# Patient Record
Sex: Female | Born: 1935 | Race: Asian | Hispanic: No | Marital: Single | Smoking: Never smoker
Health system: Southern US, Community
[De-identification: ages and names within clinical notes are randomized; demographics above are authoritative.]

## PROBLEM LIST (undated history)

## (undated) DIAGNOSIS — E119 Type 2 diabetes mellitus without complications: Secondary | ICD-10-CM

---

## 2019-05-22 ENCOUNTER — Other Ambulatory Visit: Payer: Self-pay

## 2019-05-22 ENCOUNTER — Emergency Department (HOSPITAL_BASED_OUTPATIENT_CLINIC_OR_DEPARTMENT_OTHER): Payer: Medicare HMO

## 2019-05-22 ENCOUNTER — Encounter (HOSPITAL_BASED_OUTPATIENT_CLINIC_OR_DEPARTMENT_OTHER): Payer: Self-pay | Admitting: *Deleted

## 2019-05-22 ENCOUNTER — Emergency Department (HOSPITAL_BASED_OUTPATIENT_CLINIC_OR_DEPARTMENT_OTHER)
Admission: EM | Admit: 2019-05-22 | Discharge: 2019-05-22 | Disposition: A | Discharge status: Home / Self Care | Payer: Medicare HMO | Attending: Emergency Medicine | Admitting: Emergency Medicine

## 2019-05-22 DIAGNOSIS — M79661 Pain in right lower leg: Secondary | ICD-10-CM | POA: Insufficient documentation

## 2019-05-22 DIAGNOSIS — Z7984 Long term (current) use of oral hypoglycemic drugs: Secondary | ICD-10-CM | POA: Insufficient documentation

## 2019-05-22 DIAGNOSIS — R739 Hyperglycemia, unspecified: Secondary | ICD-10-CM

## 2019-05-22 DIAGNOSIS — E1165 Type 2 diabetes mellitus with hyperglycemia: Secondary | ICD-10-CM | POA: Insufficient documentation

## 2019-05-22 DIAGNOSIS — R6883 Chills (without fever): Secondary | ICD-10-CM | POA: Diagnosis present

## 2019-05-22 DIAGNOSIS — R918 Other nonspecific abnormal finding of lung field: Secondary | ICD-10-CM | POA: Insufficient documentation

## 2019-05-22 DIAGNOSIS — N3001 Acute cystitis with hematuria: Secondary | ICD-10-CM | POA: Diagnosis not present

## 2019-05-22 DIAGNOSIS — M79662 Pain in left lower leg: Secondary | ICD-10-CM | POA: Insufficient documentation

## 2019-05-22 DIAGNOSIS — R9389 Abnormal findings on diagnostic imaging of other specified body structures: Secondary | ICD-10-CM

## 2019-05-22 HISTORY — DX: Type 2 diabetes mellitus without complications: E11.9

## 2019-05-22 LAB — CBC WITH DIFFERENTIAL/PLATELET
Abs Immature Granulocytes: 0.07 10*3/uL (ref 0.00–0.07)
Basophils Absolute: 0 10*3/uL (ref 0.0–0.1)
Basophils Relative: 0 %
Eosinophils Absolute: 0.1 10*3/uL (ref 0.0–0.5)
Eosinophils Relative: 1 %
HCT: 38.5 % (ref 36.0–46.0)
Hemoglobin: 12.8 g/dL (ref 12.0–15.0)
Immature Granulocytes: 1 %
Lymphocytes Relative: 20 %
Lymphs Abs: 2.4 10*3/uL (ref 0.7–4.0)
MCH: 28.3 pg (ref 26.0–34.0)
MCHC: 33.2 g/dL (ref 30.0–36.0)
MCV: 85 fL (ref 80.0–100.0)
Monocytes Absolute: 0.7 10*3/uL (ref 0.1–1.0)
Monocytes Relative: 6 %
Neutro Abs: 8.8 10*3/uL — ABNORMAL HIGH (ref 1.7–7.7)
Neutrophils Relative %: 72 %
Platelets: 142 10*3/uL — ABNORMAL LOW (ref 150–400)
RBC: 4.53 MIL/uL (ref 3.87–5.11)
RDW: 12.7 % (ref 11.5–15.5)
WBC: 12.1 10*3/uL — ABNORMAL HIGH (ref 4.0–10.5)
nRBC: 0 % (ref 0.0–0.2)

## 2019-05-22 LAB — BASIC METABOLIC PANEL
Anion gap: 10 (ref 5–15)
BUN: 18 mg/dL (ref 8–23)
CO2: 21 mmol/L — ABNORMAL LOW (ref 22–32)
Calcium: 9.7 mg/dL (ref 8.9–10.3)
Chloride: 100 mmol/L (ref 98–111)
Creatinine, Ser: 0.87 mg/dL (ref 0.44–1.00)
GFR calc Af Amer: >60 mL/min (ref >60)
GFR calc non Af Amer: >60 mL/min (ref >60)
Glucose, Bld: 381 mg/dL — ABNORMAL HIGH (ref 70–99)
Potassium: 4.5 mmol/L (ref 3.5–5.1)
Sodium: 131 mmol/L — ABNORMAL LOW (ref 135–145)

## 2019-05-22 LAB — URINALYSIS, ROUTINE W REFLEX MICROSCOPIC
Bilirubin Urine: NEGATIVE
Glucose, UA: >=500 mg/dL — AB
Ketones, ur: NEGATIVE mg/dL
Nitrite: NEGATIVE
Protein, ur: NEGATIVE mg/dL
Specific Gravity, Urine: <1.005 — ABNORMAL LOW (ref 1.005–1.030)
pH: 6.5 (ref 5.0–8.0)

## 2019-05-22 LAB — POCT I-STAT EG7
Acid-base deficit: 1 mmol/L (ref 0.0–2.0)
Bicarbonate: 22.6 mmol/L (ref 20.0–28.0)
Calcium, Ion: 1.27 mmol/L (ref 1.15–1.40)
HCT: 36 % (ref 36.0–46.0)
Hemoglobin: 12.2 g/dL (ref 12.0–15.0)
O2 Saturation: 95 %
Patient temperature: 98.8
Potassium: 4.1 mmol/L (ref 3.5–5.1)
Sodium: 132 mmol/L — ABNORMAL LOW (ref 135–145)
TCO2: 24 mmol/L (ref 22–32)
pCO2, Ven: 34.2 mmHg — ABNORMAL LOW (ref 44.0–60.0)
pH, Ven: 7.428 (ref 7.250–7.430)
pO2, Ven: 73 mmHg — ABNORMAL HIGH (ref 32.0–45.0)

## 2019-05-22 LAB — URINALYSIS, MICROSCOPIC (REFLEX): WBC, UA: >50 WBC/hpf (ref 0–5)

## 2019-05-22 LAB — CBG MONITORING, ED
Glucose-Capillary: 391 mg/dL — ABNORMAL HIGH (ref 70–99)
Glucose-Capillary: 258 mg/dL — ABNORMAL HIGH (ref 70–99)

## 2019-05-22 MED ORDER — SODIUM CHLORIDE 0.9 % IV BOLUS
500.0000 mL | Freq: Once | INTRAVENOUS | Status: AC
  Administered 2019-05-22: 500 mL via INTRAVENOUS

## 2019-05-22 MED ORDER — GLUCOSE BLOOD VI STRP: ORAL_STRIP | 12 refills | Status: AC

## 2019-05-22 MED ORDER — DOXYCYCLINE HYCLATE 100 MG PO CAPS: 100.0000 mg | ORAL_CAPSULE | Freq: Two times a day (BID) | ORAL | 0 refills | Status: AC

## 2019-05-22 NOTE — ED Notes (Signed)
Social work on phone with patient and family

## 2019-05-22 NOTE — ED Provider Notes (Signed)
[facility] EMERGENCY DEPARTMENT Provider Note   CSN: [id number] Arrival date & time: 05/22/19  1405     History No chief complaint on file.   Rachael Marks is a 83 y.o. female with history of diabetes mellitus presents today for evaluation of acute onset, persistent chills since yesterday.  Denies headache, chest pain, shortness of breath, cough, sore throat, nasal congestion, abdominal pain, nausea, vomiting, or diarrhea.  Denies urinary symptoms.  Also notes for the last 3 days has had bilateral lower extremity pain but worse in the left.  Denies leg swelling, recent travel or surgeries, hemoptysis, prior history of DVT or PE, or hormone replacement therapy.  She went to urgent care for her symptoms and was noted to be hyperglycemic and had a UTI.  Her rapid Covid test there was negative.  She notes no known sick contacts.  She was started on Augmentin for her UTI but it was recommended that she come to the ED for further evaluation and management.  Of note, she tells me she moved here from [place] in August and has not yet been able to set up follow-up with a primary care physician.  Reports that she ran out of glucometer lancets in November but her blood sugars were around 180 at the time and her blood sugar today was over 300.  She lives by herself 5 minutes away from her daughter.  The history is provided by the patient and a relative.       Past Medical History:  Diagnosis Date  . Diabetes mellitus without complication (HCC)     There are no problems to display for this patient.   History reviewed. No pertinent surgical history.   OB History   No obstetric history on file.     No family history on file.  Social History   Tobacco Use  . Smoking status: Never Smoker  . Smokeless tobacco: Never Used  Substance Use Topics  . Alcohol use: Never  . Drug use: Never    Home Medications Prior to Admission medications   Medication Sig Start Date End Date  Taking? Authorizing Provider  amoxicillin-clavulanate (AUGMENTIN) 500-125 MG tablet Take by mouth. 05/22/19 05/29/19 Yes [provider]  glipiZIDE (GLUCOTROL XL) 2.5 MG 24 hr tablet  04/25/19  Yes [provider]  losartan (COZAAR) 50 MG tablet  04/25/19  Yes [provider]  metFORMIN (GLUCOPHAGE) 1000 MG tablet  04/25/19  Yes [provider]  doxycycline (VIBRAMYCIN) 100 MG capsule Take 1 capsule (100 mg total) by mouth 2 (two) times daily for 5 days. 05/22/19 05/27/19  Michela Pitcher A, PA-C  glucose blood test strip Use as instructed 05/22/19   Michela Pitcher A, PA-C    Allergies    Patient has no known allergies.  Review of Systems   Review of Systems  Constitutional: Positive for chills. Negative for fever.  Respiratory: Negative for cough and shortness of breath.   Cardiovascular: Negative for chest pain and leg swelling.  Gastrointestinal: Negative for abdominal pain, diarrhea, nausea and vomiting.  Genitourinary: Negative for dysuria, frequency, hematuria and urgency.  Musculoskeletal: Positive for myalgias.  Neurological: Negative for headaches.  All other systems reviewed and are negative.   Physical Exam Updated Vital Signs BP 129/68 (BP Location: Right Arm)   Pulse 84   Temp 98.8 F (37.1 C) (Oral)   Resp 16   Ht  (1.575 m)   Wt 49.9 kg   SpO2 99%   BMI 20.12  kg/m   Physical Exam Vitals and nursing note reviewed.  Constitutional:      General: She is not in acute distress.    Appearance: She is well-developed.  HENT:     Head: Normocephalic and atraumatic.  Eyes:     General:        Right eye: No discharge.        Left eye: No discharge.     Conjunctiva/sclera: Conjunctivae normal.  Neck:     Vascular: No JVD.     Trachea: No tracheal deviation.  Cardiovascular:     Rate and Rhythm: Normal rate and regular rhythm.     Pulses: Normal pulses.     Comments: 2+ radial and DP/PT pulses bilaterally, Homans sign absent  bilaterally, no lower extremity edema, no palpable cords, compartments are soft  Pulmonary:     Effort: Pulmonary effort is normal.     Comments: Diminished breath sounds in the left lung base, crackles in the right lung base.  Speaking in full sentences without difficulty, SPO2 saturations 94 to 95% on room air. Abdominal:     General: Abdomen is flat. There is no distension.     Palpations: Abdomen is soft.     Tenderness: There is no abdominal tenderness. There is no right CVA tenderness, left CVA tenderness, guarding or rebound.  Musculoskeletal:     Cervical back: Normal range of motion and neck supple.  Skin:    General: Skin is warm and dry.     Findings: No erythema.  Neurological:     Mental Status: She is alert.  Psychiatric:        Behavior: Behavior normal.     ED Results / Procedures / Treatments   Labs (all labs ordered are listed, but only abnormal results are displayed) Labs Reviewed  CBC WITH DIFFERENTIAL/PLATELET - Abnormal; Notable for the following components:      Result Value   WBC 12.1 (*)    Platelets 142 (*)    Neutro Abs 8.8 (*)    All other components within normal limits  BASIC METABOLIC PANEL - Abnormal; Notable for the following components:   Sodium 131 (*)    CO2 21 (*)    Glucose, Bld 381 (*)    All other components within normal limits  URINALYSIS, ROUTINE W REFLEX MICROSCOPIC - Abnormal; Notable for the following components:   APPearance HAZY (*)    Specific Gravity, Urine <1.005 (*)    Glucose, UA >=500 (*)    Hgb urine dipstick SMALL (*)    Leukocytes,Ua MODERATE (*)    All other components within normal limits  URINALYSIS, MICROSCOPIC (REFLEX) - Abnormal; Notable for the following components:   Bacteria, UA FEW (*)    All other components within normal limits  CBG MONITORING, ED - Abnormal; Notable for the following components:   Glucose-Capillary 391 (*)    All other components within normal limits  POCT I-STAT EG7 - Abnormal;  Notable for the following components:   pCO2, Ven 34.2 (*)    pO2, Ven 73.0 (*)    Sodium 132 (*)    All other components within normal limits  CBG MONITORING, ED - Abnormal; Notable for the following components:   Glucose-Capillary 258 (*)    All other components within normal limits  URINE CULTURE  I-STAT VENOUS BLOOD GAS, ED    EKG None  Radiology DG Chest 2 View  Result Date: 05/22/2019 CLINICAL DATA:  Fever and chills. EXAM: CHEST - 2  VIEW COMPARISON:  None. FINDINGS: Heart size is normal. Mediastinal shadows are normal. Minimal linear atelectasis at the left lung base. Question patchy infiltrate versus scarring at the left apex. No lobar collapse. No effusion. No significant bone finding. IMPRESSION: Mild atelectasis at the left base. Slight increased density in the left upper lobe which could simply be scarring. Left upper lobe pneumonia or mass not exclude. Consider chest CT. Electronically Signed   By: Paulina FusiMark  Shogry M.D.   On: 05/22/2019 15:46   US Venous Img Lower  Left (DVT Study)  Result Date: 05/22/2019 CLINICAL DATA:  Left lower extremity pain.  Evaluate for DVT. EXAM: LEFT LOWER EXTREMITY VENOUS DOPPLER ULTRASOUND TECHNIQUE: Gray-scale sonography with graded compression, as well as color Doppler and duplex ultrasound were performed to evaluate the lower extremity deep venous systems from the level of the common femoral vein and including the common femoral, femoral, profunda femoral, popliteal and calf veins including the posterior tibial, peroneal and gastrocnemius veins when visible. The superficial great saphenous vein was also interrogated. Spectral Doppler was utilized to evaluate flow at rest and with distal augmentation maneuvers in the common femoral, femoral and popliteal veins. COMPARISON:  None. FINDINGS: Contralateral Common Femoral Vein: Respiratory phasicity is normal and symmetric with the symptomatic side. No evidence of thrombus. Normal compressibility. Common  Femoral Vein: No evidence of thrombus. Normal compressibility, respiratory phasicity and response to augmentation. Saphenofemoral Junction: No evidence of thrombus. Normal compressibility and flow on color Doppler imaging. Profunda Femoral Vein: No evidence of thrombus. Normal compressibility and flow on color Doppler imaging. Femoral Vein: No evidence of thrombus. Normal compressibility, respiratory phasicity and response to augmentation. Popliteal Vein: No evidence of thrombus. Normal compressibility, respiratory phasicity and response to augmentation. Calf Veins: No evidence of thrombus. Normal compressibility and flow on color Doppler imaging. Superficial Great Saphenous Vein: No evidence of thrombus. Normal compressibility. Venous Reflux:  None. Other Findings:  None. IMPRESSION: No evidence of DVT within the left lower extremity. Electronically Signed   By: Simonne ComeJohn  Watts M.D.   On: 05/22/2019 16:18    Procedures Procedures (including critical care time)  Medications Ordered in ED Medications  sodium chloride 0.9 % bolus 500 mL (0 mLs Intravenous Stopped 05/22/19 1641)  sodium chloride 0.9 % bolus 500 mL (0 mLs Intravenous Stopped 05/22/19 1729)    ED Course  I have reviewed the triage vital signs and the nursing notes.  Pertinent labs & imaging results that were available during my care of the patient were reviewed by me and considered in my medical decision making (see chart for details).    MDM Rules/Calculators/A&P                      Patient sent from urgent care to evaluate hyperglycemia.  [place] and has not yet been able to establish primary care here.  Ran out of her glucometer strips last month and states her sugars typically run around the 180s.  Initially patient is afebrile, mildly tachycardic but vital signs otherwise stable.  She is nontoxic in appearance.  Abdomen is soft and nontender.  Lab work reviewed by me shows mild  nonspecific leukocytosis, no anemia, hyperglycemia with glucose of 381 and bicarb is a little low at 21 however anion gap is within normal limits and her blood pH is normal on her VBG.  No ketones in her urine so by definition she is not in DKA.  Her corrected sodium is  138 so she is not truly hyponatremic.  Her UA is concerning for UTI, will culture.  Urgent care already started her on Augmentin.  Her chest x-ray shows potential left upper lobe pneumonia, will add doxycycline to treat but she needs to follow-up with PCP for reevaluation and consideration of potential CT scan.  Her DVT study was negative and I have low suspicion of DVT or PE at this time.  O2 saturations have been stable no signs of respiratory distress.  On reevaluation she is resting comfortably in no apparent distress.  She received IV fluids in the ED with improvement in her blood glucose down to 258.  Emphasized importance of follow-up with a PCP in case management has been consulted and will assist patient and daughter in finding a PCP.  Discussed strict ED return precautions.  Patient and daughter verbalized understanding of and agreement with plan and patient stable for discharge home at this time.  Patient was seen and evaluate by Dr. Dalene Seltzer who agrees with assessment and plan at this time. Final Clinical Impression(s) / ED Diagnoses Final diagnoses:  Hyperglycemia  Acute cystitis with hematuria  Abnormal chest x-ray    Rx / DC Orders ED Discharge Orders         Ordered    doxycycline (VIBRAMYCIN) 100 MG capsule  2 times daily     05/22/19 1807    glucose blood test strip     05/22/19 1807           Jeanie Sewer, PA-C 05/22/19 1811    Alvira Monday, MD 05/23/19 1301

## 2019-05-22 NOTE — Discharge Instructions (Signed)
Please take all of your antibiotics until finished!   Take your antibiotics with food.  Common side effects of antibiotics include nausea, vomiting, abdominal discomfort, and diarrhea. You may help offset some of this with probiotics which you can buy or get in yogurt. Do not eat  or take the probiotics until 2 hours after your antibiotic.    Continue taking home medications as prescribed.  Please follow-up with primary care provider for reevaluation of your UTI and abnormal chest x-ray.  They may want to do additional imaging in the future.  Return to the emergency department if any concerning signs or symptoms develop such as fevers, persistent vomiting, severe shortness of breath or chest pain, or loss of consciousness.

## 2019-05-22 NOTE — Progress Notes (Addendum)
TOC CM spoke to pt and pt's dtr, Odie Sera. Dtr states they are in the process of locating a PCP. Dtr will contact [employer] for list of providers in her area and schedule appt. TOC CM provided information for Primary Care at [facility]. Updated dtr. States they just moved to [place] in the past few months. [place], [place] ED TOC CM [phone]

## 2019-05-22 NOTE — ED Triage Notes (Signed)
States her BS was 385 this am. Chills last night.

## 2019-05-24 LAB — URINE CULTURE: Culture: >=100000 — AB

## 2019-05-25 ENCOUNTER — Telehealth: Payer: Self-pay

## 2019-05-25 NOTE — Telephone Encounter (Signed)
Post ED Visit - Positive Culture Follow-up  Culture report reviewed by antimicrobial stewardship pharmacist: [place] Team []  Elenor Quinones, Pharm.D. []  Heide Guile, Pharm.D., BCPS AQ-ID []  Parks Neptune, Pharm.D., BCPS []  Alycia Rossetti, Pharm.D., BCPS []  [place], Pharm.D., BCPS, AAHIVP []  Legrand Como, Pharm.D., BCPS, AAHIVP []  Salome Arnt, PharmD, BCPS []  Johnnette Gourd, PharmD, BCPS []  Hughes Better, PharmD, BCPS []  Leeroy Cha, PharmD []  Laqueta Linden, PharmD, BCPS []  Albertina Parr, PharmD [place] Team []  Leodis Sias, PharmD []  Lindell Spar, PharmD []  Royetta Asal, PharmD []  Graylin Shiver, Rph []  Rema Fendt) Glennon Mac, PharmD []  Arlyn Dunning, PharmD []  Netta Cedars, PharmD []  Dia Sitter, PharmD []  Leone Haven, PharmD []  Gretta Arab, PharmD []  Theodis Shove, PharmD []  Peggyann Juba, PharmD []  Reuel Boom, PharmD   Positive urine culture Treated with augmentin, organism sensitive to the same and no further patient follow-up is required at this time.  Genia Del 05/25/2019, 10:03 AM

## 2021-12-06 IMAGING — DX DG CHEST 2V
2 series · 2 of 2 positions shown · non-contrast
Comparison: None.

CLINICAL DATA: Fever and chills.

EXAM:
CHEST - 2 VIEW

[chest pa]
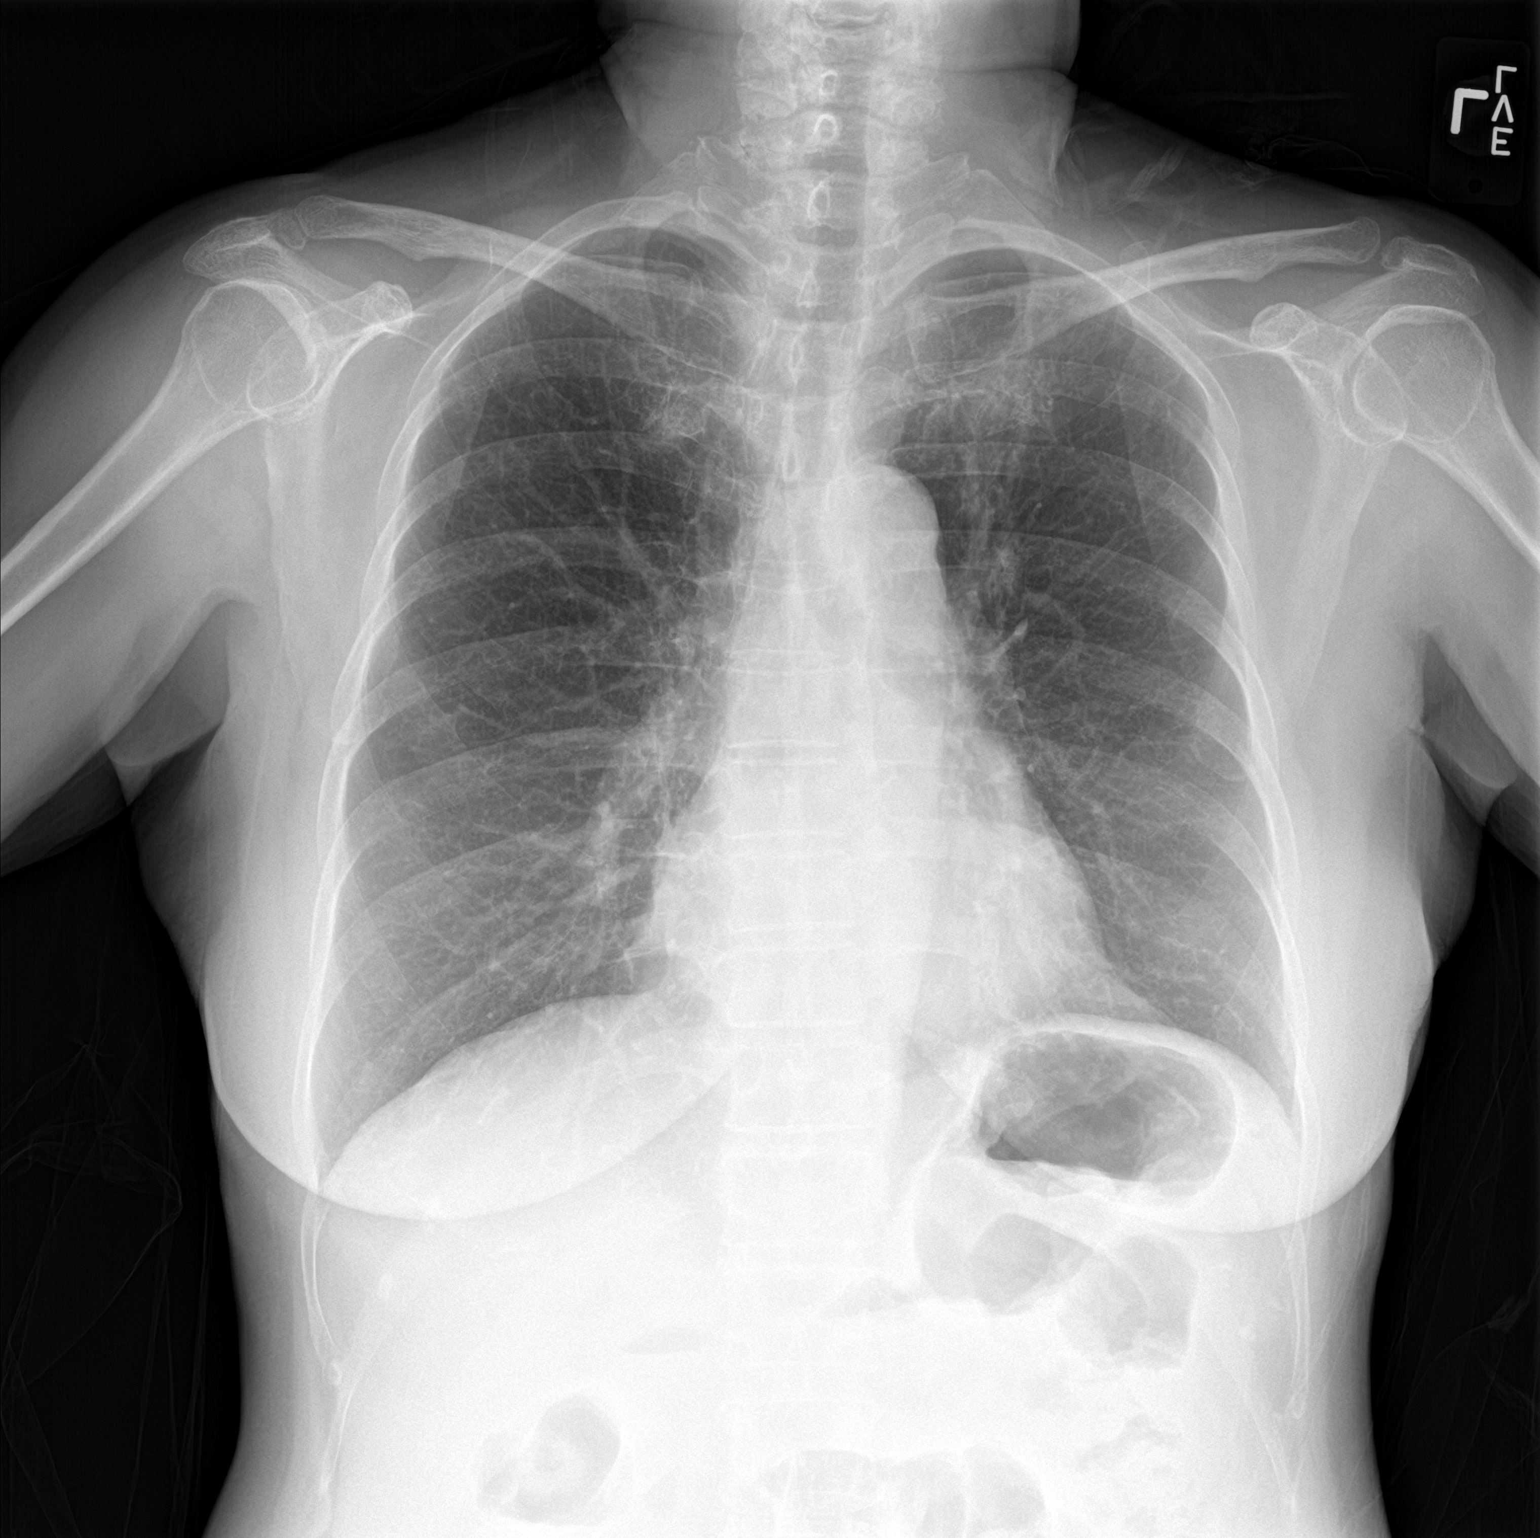

[chest lat]
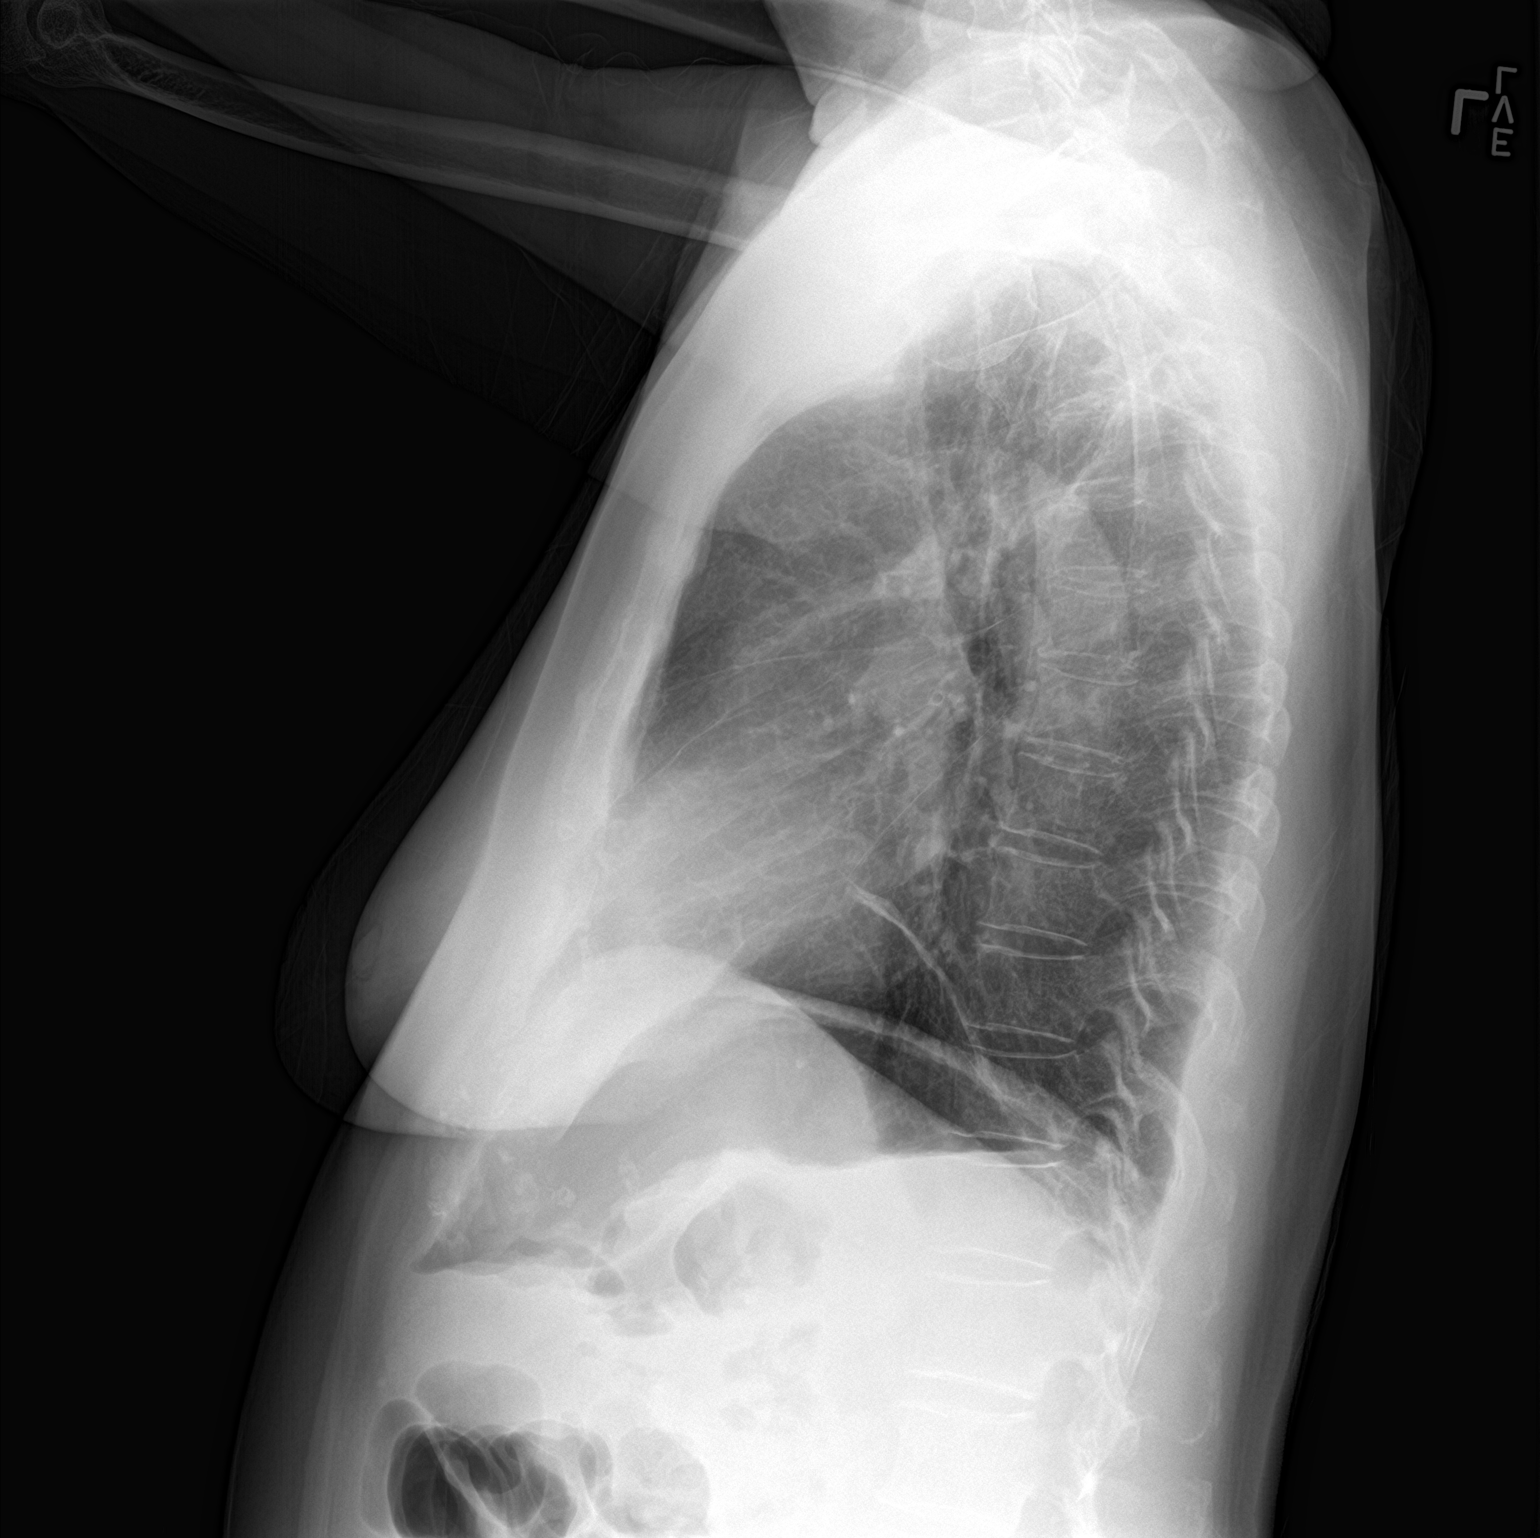

[2 of 2 positions shown; findings below may reference images not displayed]

FINDINGS: Heart size is normal. Mediastinal shadows are normal. Minimal linear
atelectasis at the left lung base. Question patchy infiltrate versus
scarring at the left apex. No lobar collapse. No effusion. No
significant bone finding.
IMPRESSION: Mild atelectasis at the left base.

Slight increased density in the left upper lobe which could simply
be scarring. Left upper lobe pneumonia or mass not exclude. Consider
chest CT.

## 2023-06-04 ENCOUNTER — Encounter (HOSPITAL_BASED_OUTPATIENT_CLINIC_OR_DEPARTMENT_OTHER): Payer: Self-pay

## 2023-06-04 ENCOUNTER — Emergency Department (HOSPITAL_BASED_OUTPATIENT_CLINIC_OR_DEPARTMENT_OTHER)
Admission: EM | Admit: 2023-06-04 | Discharge: 2023-06-04 | Disposition: A | Discharge status: Home / Self Care | Payer: Medicare HMO | Attending: Emergency Medicine | Admitting: Emergency Medicine

## 2023-06-04 ENCOUNTER — Other Ambulatory Visit: Payer: Self-pay

## 2023-06-04 DIAGNOSIS — H81391 Other peripheral vertigo, right ear: Secondary | ICD-10-CM | POA: Diagnosis not present

## 2023-06-04 DIAGNOSIS — R42 Dizziness and giddiness: Secondary | ICD-10-CM | POA: Diagnosis present

## 2023-06-04 DIAGNOSIS — E119 Type 2 diabetes mellitus without complications: Secondary | ICD-10-CM | POA: Insufficient documentation

## 2023-06-04 DIAGNOSIS — Z7984 Long term (current) use of oral hypoglycemic drugs: Secondary | ICD-10-CM | POA: Insufficient documentation

## 2023-06-04 LAB — URINALYSIS, ROUTINE W REFLEX MICROSCOPIC
Bilirubin Urine: NEGATIVE
Glucose, UA: >=500 mg/dL — AB
Hgb urine dipstick: NEGATIVE
Ketones, ur: NEGATIVE mg/dL
Nitrite: NEGATIVE
Protein, ur: NEGATIVE mg/dL
Specific Gravity, Urine: <=1.005 (ref 1.005–1.030)
pH: 5.5 (ref 5.0–8.0)

## 2023-06-04 LAB — URINALYSIS, MICROSCOPIC (REFLEX)

## 2023-06-04 LAB — CBC
HCT: 39.5 % (ref 36.0–46.0)
Hemoglobin: 13.2 g/dL (ref 12.0–15.0)
MCH: 28.9 pg (ref 26.0–34.0)
MCHC: 33.4 g/dL (ref 30.0–36.0)
MCV: 86.4 fL (ref 80.0–100.0)
Platelets: 186 10*3/uL (ref 150–400)
RBC: 4.57 MIL/uL (ref 3.87–5.11)
RDW: 13.2 % (ref 11.5–15.5)
WBC: 6.6 10*3/uL (ref 4.0–10.5)
nRBC: 0 % (ref 0.0–0.2)

## 2023-06-04 LAB — TROPONIN I (HIGH SENSITIVITY): Troponin I (High Sensitivity): 3 ng/L (ref <18)

## 2023-06-04 LAB — BASIC METABOLIC PANEL
Anion gap: 9 (ref 5–15)
BUN: 22 mg/dL (ref 8–23)
CO2: 23 mmol/L (ref 22–32)
Calcium: 9.3 mg/dL (ref 8.9–10.3)
Chloride: 103 mmol/L (ref 98–111)
Creatinine, Ser: 0.72 mg/dL (ref 0.44–1.00)
GFR, Estimated: >60 mL/min (ref >60)
Glucose, Bld: 187 mg/dL — ABNORMAL HIGH (ref 70–99)
Potassium: 4.1 mmol/L (ref 3.5–5.1)
Sodium: 135 mmol/L (ref 135–145)

## 2023-06-04 LAB — CBG MONITORING, ED: Glucose-Capillary: 183 mg/dL — ABNORMAL HIGH (ref 70–99)

## 2023-06-04 MED ORDER — MECLIZINE HCL 12.5 MG PO TABS: 12.5000 mg | ORAL_TABLET | Freq: Three times a day (TID) | ORAL | 0 refills | Status: AC | PRN

## 2023-06-04 NOTE — ED Notes (Signed)

## 2023-06-04 NOTE — ED Provider Notes (Incomplete)
[place] [facility]ERGENCY DEPARTMENT AT [facility] Provider Note   CSN: [id number] Arrival date & time: 06/04/23  1404     History {Add pertinent medical, surgical, social history, OB history to HPI:1} Chief Complaint  Patient presents with   Dizziness    Rachael Marks is a 87 y.o. female who presents emergency department for dizziness.  She is a past medical history of diabetes and takes metformin Jardiance and glipizide.  She has no other medication changes patient reports that her brother in [place] recently died.  She is not sure if this has to do with her symptoms but she does state that in the morning 10 days ago after she found out her brother died she woke up.  When she got out of bed very quickly she felt dizzy and lightheaded.  She laid back down for a few minute then stood up slowly and had significant improvement in her symptoms.  She does report that when she turns her head toward the right she still feels those symptoms a little bit.  He has no previous history of vertigo.  She denies ataxia denies vision changes nausea vomiting weakness or sensory deficit.  Patient also reports that every morning when she wakes up the symptoms return but they have never been as strong as the first episode.  Not taken any medications.  She is returning to [place] for her brother's funeral in 2 days and wants to make sure she is okay.   Dizziness      Home Medications Prior to Admission medications   Medication Sig Start Date End Date Taking? Authorizing Provider  glipiZIDE (GLUCOTROL XL) 2.5 MG 24 hr tablet  04/25/19   [provider]  glucose blood test strip Use as instructed 05/22/19   Michela Pitcher A, PA-C  losartan (COZAAR) 50 MG tablet  04/25/19   [provider]  metFORMIN (GLUCOPHAGE) 1000 MG tablet  04/25/19   [provider]      Allergies    Patient has no known allergies.    Review of Systems   Review of Systems  Neurological:  Positive for  dizziness.    Physical Exam Updated Vital Signs BP 138/66   Pulse 84   Temp 97.6 F (36.4 C)   Resp 20   SpO2 100%  Physical Exam Vitals and nursing note reviewed.  Constitutional:      General: She is not in acute distress.    Appearance: She is well-developed. She is not diaphoretic.  HENT:     Head: Normocephalic and atraumatic.     Right Ear: External ear normal.     Left Ear: External ear normal.     Nose: Nose normal.     Mouth/Throat:     Mouth: Mucous membranes are moist.  Eyes:     General: No scleral icterus.    Extraocular Movements: Extraocular movements intact.     Conjunctiva/sclera: Conjunctivae normal.     Pupils: Pupils are equal, round, and reactive to light.  Cardiovascular:     Rate and Rhythm: Normal rate and regular rhythm.     Heart sounds: Normal heart sounds. No murmur heard.    No friction rub. No gallop.  Pulmonary:     Effort: Pulmonary effort is normal. No respiratory distress.     Breath sounds: Normal breath sounds.  Abdominal:     General: Bowel sounds are normal. There is no distension.     Palpations: Abdomen is soft. There is no mass.  Tenderness: There is no abdominal tenderness. There is no guarding.  Musculoskeletal:     Cervical back: Normal range of motion.  Skin:    General: Skin is warm and dry.  Neurological:     Mental Status: She is alert and oriented to person, place, and time.     Comments: Speech is clear and goal oriented, follows commands Major Cranial nerves without deficit, no facial droop Patient does have dizziness which is brief and fleeting when she turns her eyes toward the right side. Normal strength in upper and lower extremities bilaterally including dorsiflexion and plantar flexion, strong and equal grip strength Sensation normal to light and sharp touch Moves extremities without ataxia, coordination intact Normal finger to nose and rapid alternating movements Neg romberg, no pronator drift Normal  gait Normal heel-shin and balance   Psychiatric:        Behavior: Behavior normal.     ED Results / Procedures / Treatments   Labs (all labs ordered are listed, but only abnormal results are displayed) Labs Reviewed  BASIC METABOLIC PANEL - Abnormal; Notable for the following components:      Result Value   Glucose, Bld 187 (*)    All other components within normal limits  URINALYSIS, ROUTINE W REFLEX MICROSCOPIC - Abnormal; Notable for the following components:   Glucose, UA >=500 (*)    Leukocytes,Ua TRACE (*)    All other components within normal limits  URINALYSIS, MICROSCOPIC (REFLEX) - Abnormal; Notable for the following components:   Bacteria, UA FEW (*)    All other components within normal limits  CBG MONITORING, ED - Abnormal; Notable for the following components:   Glucose-Capillary 183 (*)    All other components within normal limits  CBC  TROPONIN I (HIGH SENSITIVITY)    EKG EKG Interpretation Date/Time:  Friday June 04 2023 14:25:08 EST Ventricular Rate:  88 PR Interval:  145 QRS Duration:  94 QT Interval:  374 QTC Calculation: 453 R Axis:   62  Text Interpretation: Sinus rhythm Confirmed by Alona Bene [phone]) on 06/04/2023 2:34:41 PM  Radiology No results found.  Procedures Procedures  {Document cardiac monitor, telemetry assessment procedure when appropriate:1}  Medications Ordered in ED Medications - No data to display  ED Course/ Medical Decision Making/ A&P   {   Click here for ABCD2, HEART and other calculatorsREFRESH Note before signing :1}                              Medical Decision Making Amount and/or Complexity of Data Reviewed Labs: ordered.   ***  {Document critical care time when appropriate:1} {Document review of labs and clinical decision tools ie heart score, Chads2Vasc2 etc:1}  {Document your independent review of radiology images, and any outside records:1} {Document your discussion with family members,  caretakers, and with consultants:1} {Document social determinants of health affecting pt's care:1} {Document your decision making why or why not admission, treatments were needed:1} Final Clinical Impression(s) / ED Diagnoses Final diagnoses:  None    Rx / DC Orders ED Discharge Orders     None

## 2023-06-04 NOTE — ED Notes (Signed)
ED Provider at bedside. 

## 2023-06-04 NOTE — ED Triage Notes (Signed)
Pt c/o lightheadedness for the 10 days.  Denies any problems with her vision, no chest pain or sob.

## 2023-06-04 NOTE — Discharge Instructions (Signed)
Contact a health care provider if: You have a fever. Your condition gets worse or you develop new symptoms. Your family or friends notice any behavioral changes. You have nausea or vomiting that gets worse. You have numbness or a prickling and tingling sensation. Get help right away if you: Have difficulty speaking or moving. Are always dizzy or faint. Develop severe headaches. Have weakness in your legs or arms. Have changes in your hearing or vision. Develop a stiff neck. Develop sensitivity to light. These symptoms may represent a serious problem that is an emergency. Do not wait to see if the symptoms will go away. Get medical help right away. Call your local emergency services (911 in the [place] Do not drive yourself to the hospital.

## 2023-08-12 ENCOUNTER — Other Ambulatory Visit: Payer: Self-pay | Admitting: Physician Assistant

## 2023-08-12 DIAGNOSIS — H9120 Sudden idiopathic hearing loss, unspecified ear: Secondary | ICD-10-CM
